# Patient Record
Sex: Female | Born: 1935 | Race: White | Hispanic: No
Health system: Southern US, Community
[De-identification: ages and names within clinical notes are randomized; demographics above are authoritative.]

---

## 2003-09-07 ENCOUNTER — Other Ambulatory Visit: Payer: Self-pay

## 2004-02-14 ENCOUNTER — Ambulatory Visit: Payer: Self-pay | Admitting: General Surgery

## 2004-12-13 ENCOUNTER — Ambulatory Visit: Payer: Self-pay | Admitting: General Surgery

## 2011-01-11 ENCOUNTER — Ambulatory Visit: Payer: Self-pay | Admitting: Gastroenterology

## 2011-09-12 ENCOUNTER — Ambulatory Visit: Payer: Self-pay | Admitting: Gastroenterology

## 2011-09-18 ENCOUNTER — Ambulatory Visit: Payer: Self-pay | Admitting: Gastroenterology

## 2011-10-01 ENCOUNTER — Ambulatory Visit: Payer: Self-pay | Admitting: Internal Medicine

## 2011-10-01 ENCOUNTER — Emergency Department: Payer: Self-pay | Admitting: Emergency Medicine

## 2011-10-01 LAB — BASIC METABOLIC PANEL
Anion Gap: 6 — ABNORMAL LOW (ref 7–16)
Chloride: 105 mmol/L (ref 98–107)
Co2: 29 mmol/L (ref 21–32)
EGFR (African American): >60
Glucose: 123 mg/dL — ABNORMAL HIGH (ref 65–99)
Potassium: 4.2 mmol/L (ref 3.5–5.1)

## 2011-10-01 LAB — CBC
HCT: 41.7 % (ref 35.0–47.0)
HGB: 14 g/dL (ref 12.0–16.0)
MCH: 29.4 pg (ref 26.0–34.0)
MCHC: 33.5 g/dL (ref 32.0–36.0)
MCV: 88 fL (ref 80–100)
RBC: 4.76 10*6/uL (ref 3.80–5.20)
RDW: 18.4 % — ABNORMAL HIGH (ref 11.5–14.5)

## 2011-10-01 LAB — PROTIME-INR: Prothrombin Time: 13.3 secs (ref 11.5–14.7)

## 2011-10-01 LAB — HEPATIC FUNCTION PANEL A (ARMC)
Alkaline Phosphatase: 77 U/L (ref 50–136)
Bilirubin, Direct: 0.1 mg/dL (ref 0.00–0.20)
Bilirubin,Total: 0.4 mg/dL (ref 0.2–1.0)
SGPT (ALT): 14 U/L (ref 12–78)
Total Protein: 6.6 g/dL (ref 6.4–8.2)

## 2011-10-01 LAB — URINALYSIS, COMPLETE
Bacteria: NONE SEEN
Bilirubin,UR: NEGATIVE
Blood: NEGATIVE
Ketone: NEGATIVE
RBC,UR: 1 /HPF (ref 0–5)
Squamous Epithelial: NONE SEEN
WBC UR: 1 /HPF (ref 0–5)

## 2011-12-07 DEATH — deceased

## 2013-05-26 IMAGING — CT CT ABD-PELV W/ CM
1 of 3 series · 14 of 32 positions shown, 19 images · IV contrast (isovue)
Comparison: None

REASON FOR EXAM: weight loss dyspepsia gen abd discomfort and pain
COMMENTS:

PROCEDURE:     KCT - KCT ABDOMEN/PELVIS W  - September 18, 2011 [DATE]
RESULT:     History: Dyspepsia, weight loss
TECHNIQUE: Multiple axial images of the abdomen and pelvis were performed
from the lung bases to the pubic symphysis, with p.o. contrast and with 85
ml of Isovue 300 intravenous contrast.

[Series 2: abd 3mm w 3.0 i40f 3 · axial · 0.70mm/px · z∈[-979,-637]mm · 14 of 130 slices shown, 19 images]
[im 8/130  soft-tissue]
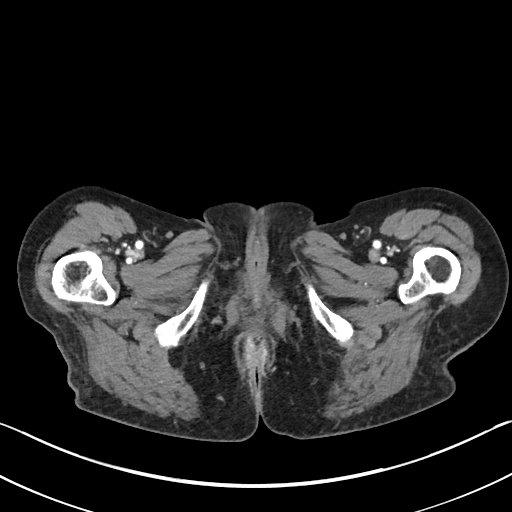
[im 8/130  bone]
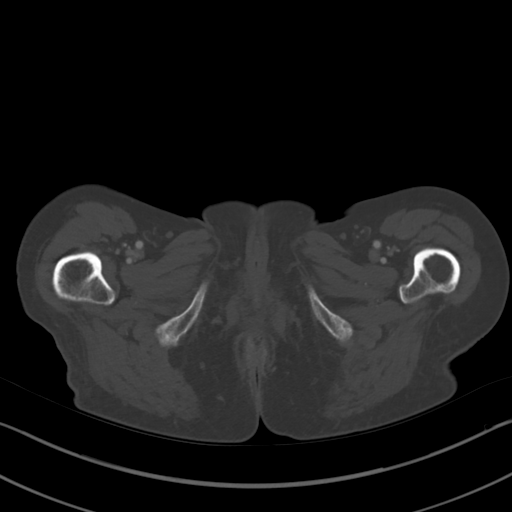
[im 15/130  soft-tissue]
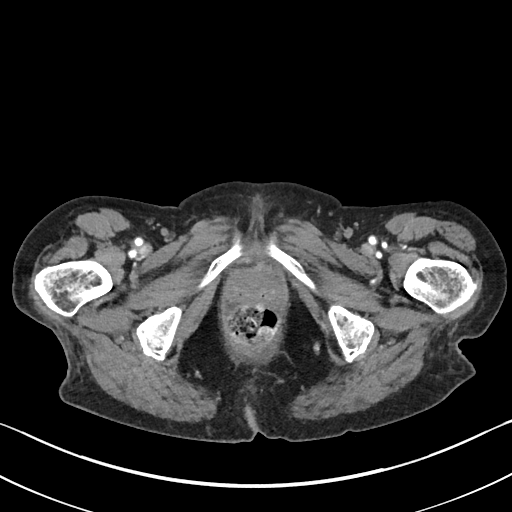
[im 29/130  soft-tissue]
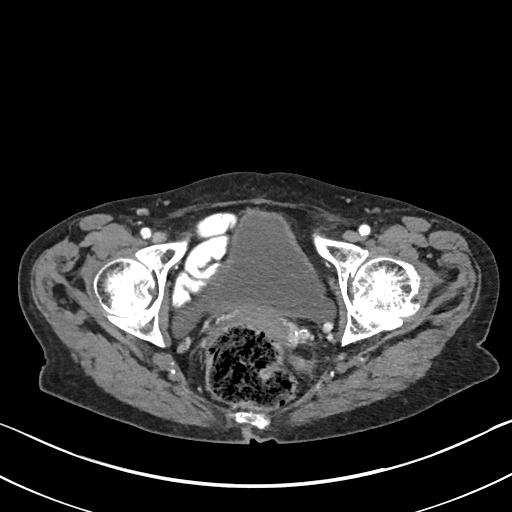
[im 36/130  soft-tissue]
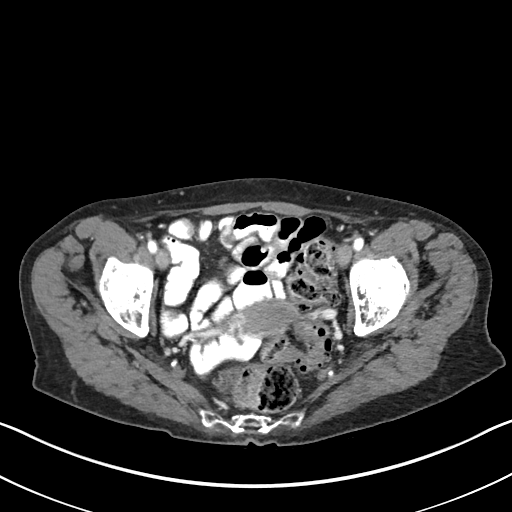
[im 44/130  soft-tissue]
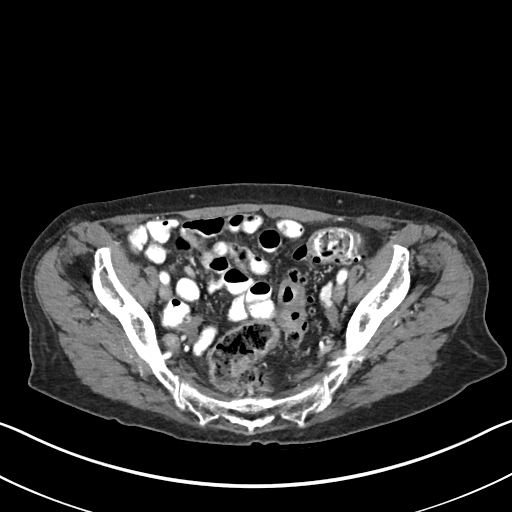
[im 58/130  soft-tissue]
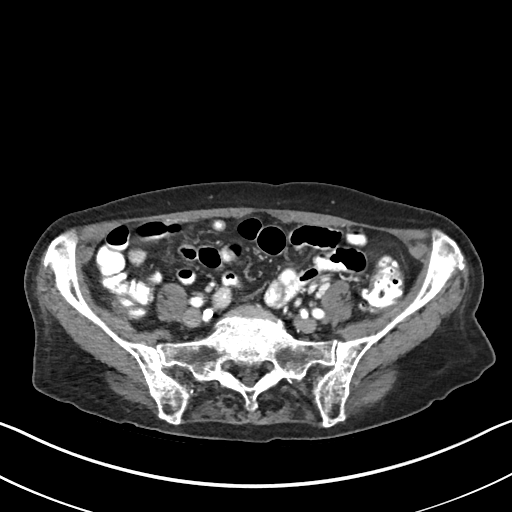
[im 65/130  soft-tissue]
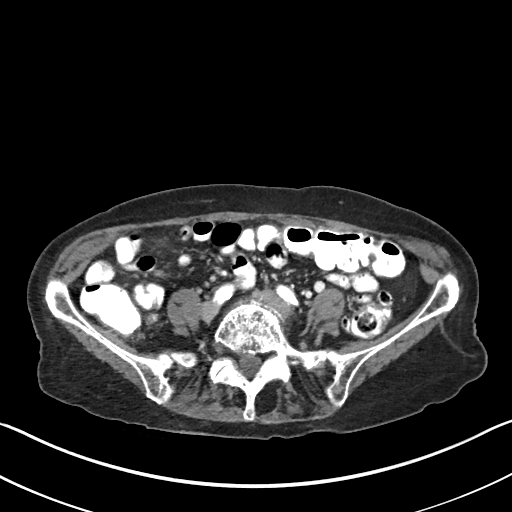
[im 72/130  soft-tissue]
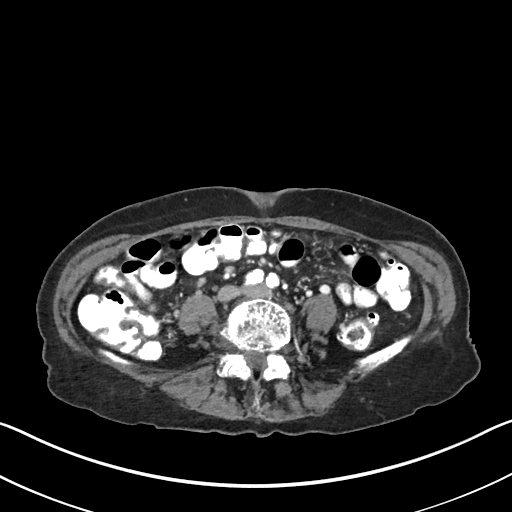
[im 87/130  soft-tissue]
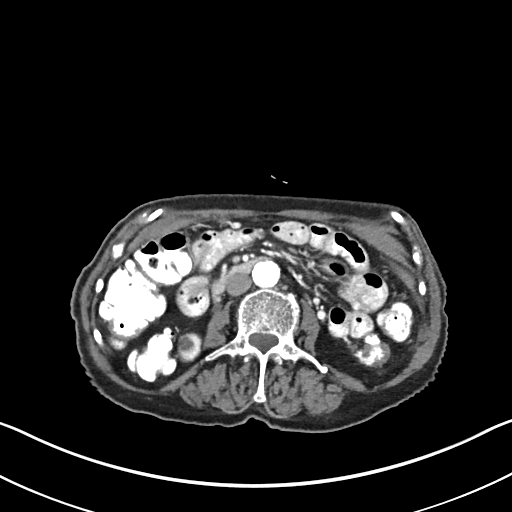
[im 87/130  bone]
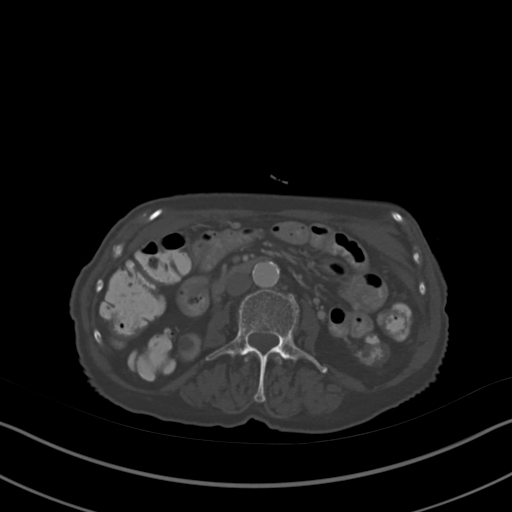
[im 94/130  soft-tissue]
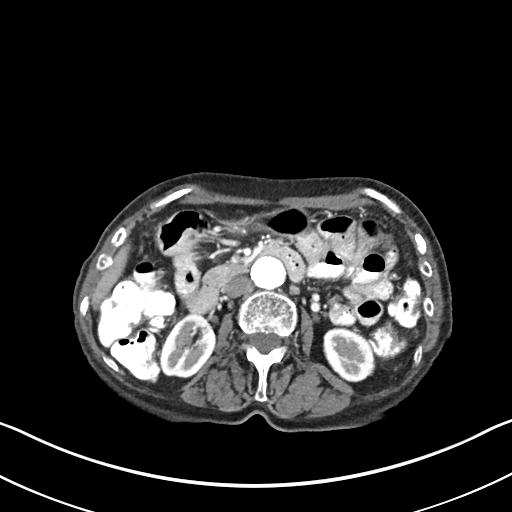
[im 101/130  soft-tissue]
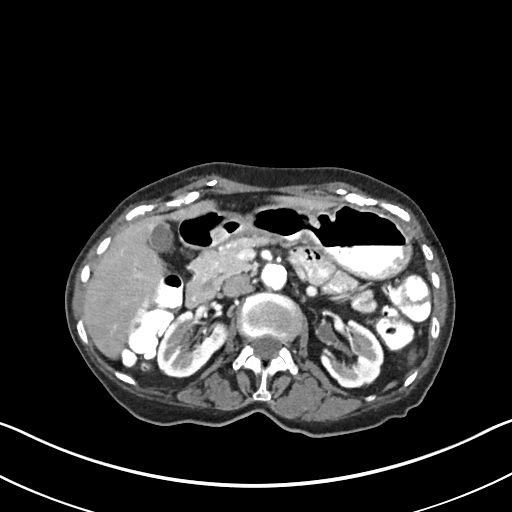
[im 101/130  lung]
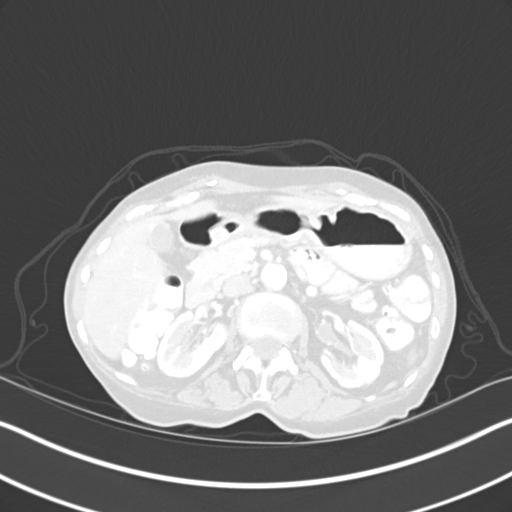
[im 108/130  lung]
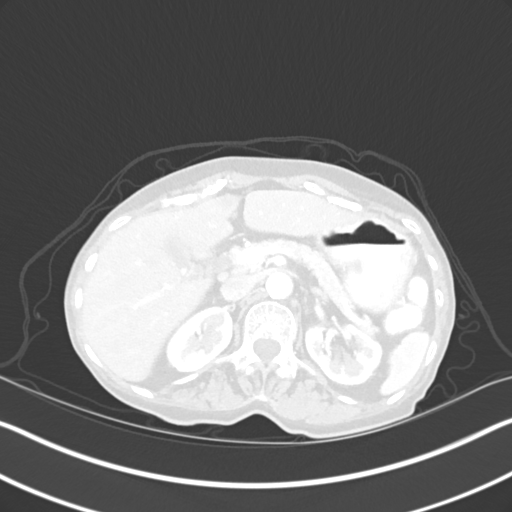
[im 115/130  soft-tissue]
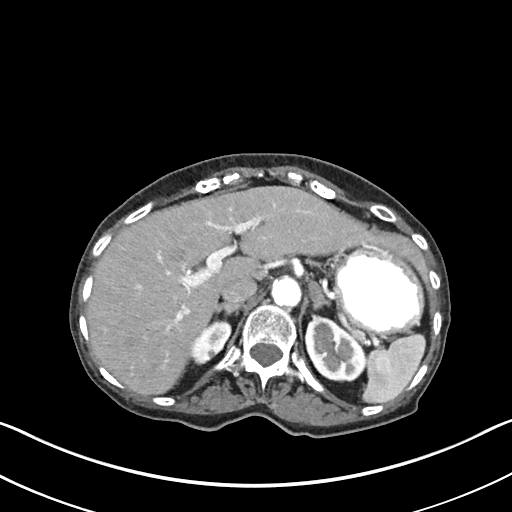
[im 115/130  lung]
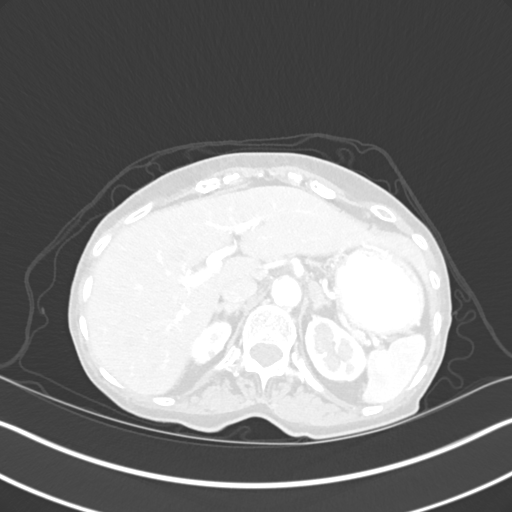
[im 122/130  soft-tissue]
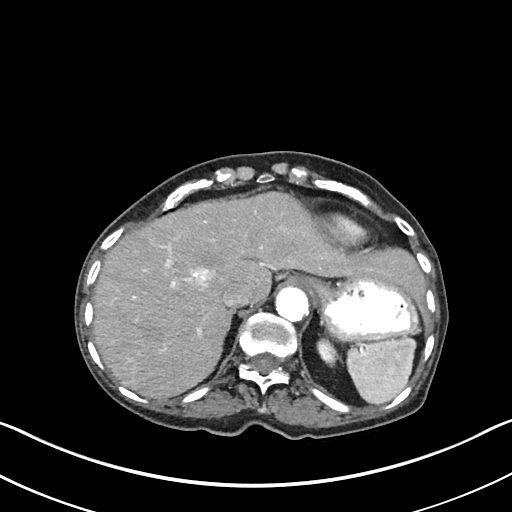
[im 122/130  lung]
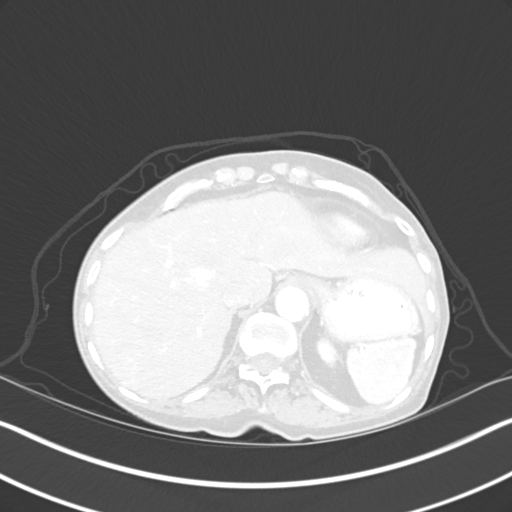

[14 of 32 positions shown; findings below may reference images not displayed]

FINDINGS: The lung bases are clear. There is no pneumothorax. The heart size is
normal. There is coronary artery atherosclerosis.

The liver demonstrates no focal abnormality. There is no intrahepatic or
extrahepatic biliary ductal dilatation. The gallbladder is unremarkable. The
spleen demonstrates no focal abnormality. The kidneys, adrenal glands, and
pancreas are normal. The bladder is unremarkable.

The stomach, duodenum, small intestine, and large intestine demonstrate no
contrast extravasation or dilatation. There is diverticulosis without
evidence of diverticulitis. There is a large amount of stool in the rectum.
There is no pneumoperitoneum, pneumatosis, or portal venous gas. There is no
abdominal or pelvic free fluid. There is no lymphadenopathy.

The abdominal aorta is normal in caliber with atherosclerosis.

The osseous structures are unremarkable.
IMPRESSION: 1. No acute abdominal or pelvic pathology.

[REDACTED]

## 2013-06-08 IMAGING — CT CT CHEST W/ CM
1 series · 15 of 33 positions shown, 19 images · IV contrast (agent unspecified)
Comparison: none

REASON FOR EXAM: Labs left hilar mass seen on chest xray
COMMENTS:

PROCEDURE:     KCT - KCT CHEST WITH CONTRAST  - October 01, 2011  [DATE]
RESULT:     A cecal left hilar mass
Comparison Study: No recent.

[Series 2: chest w/ 3.0 i41f 3 · axial · 0.63mm/px · z∈[-606,-348]mm · 15 of 102 slices shown, 19 images]
[im 8/102  mediastinal]
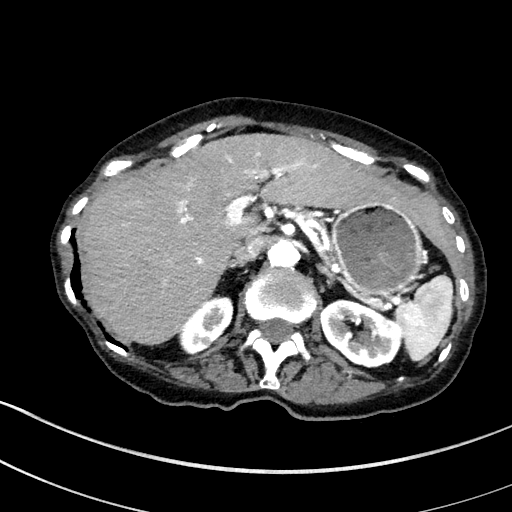
[im 8/102  lung]
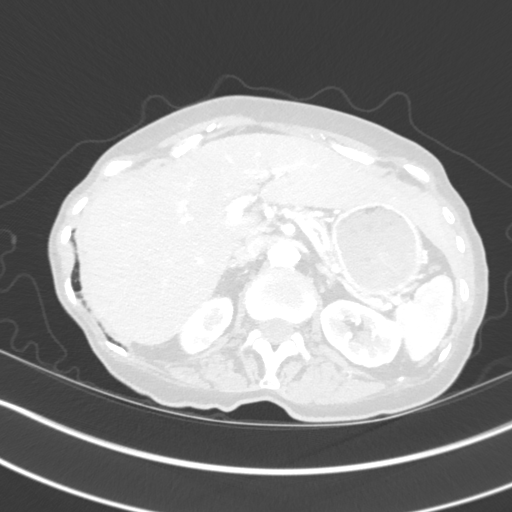
[im 15/102  lung]
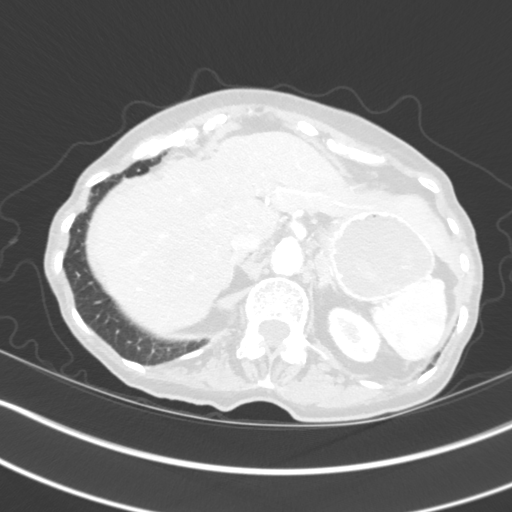
[im 21/102  lung]
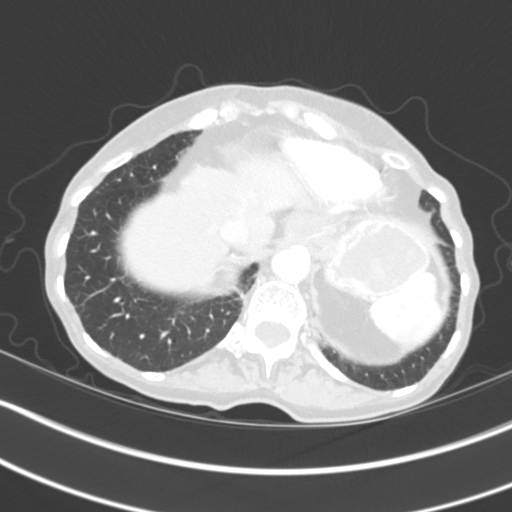
[im 27/102  lung]
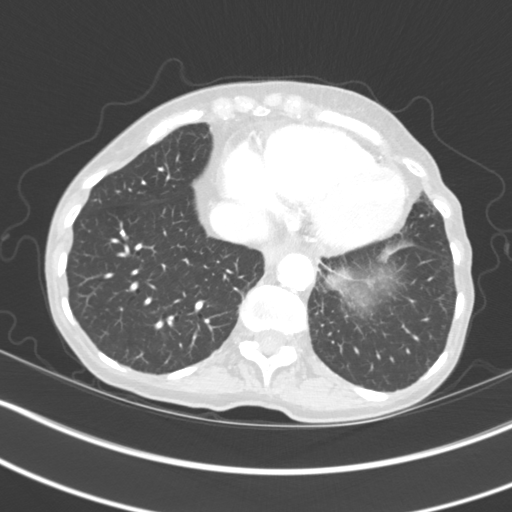
[im 34/102  mediastinal]
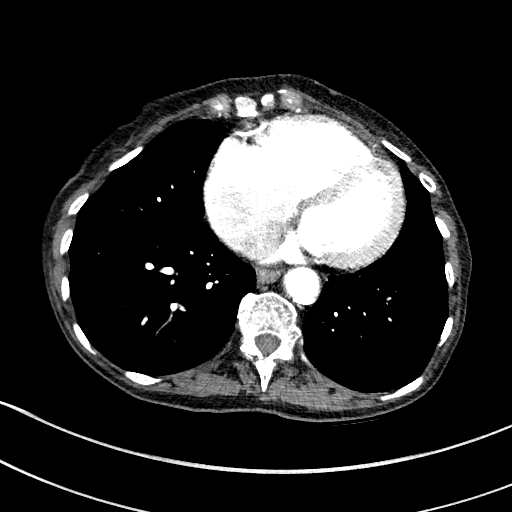
[im 34/102  lung]
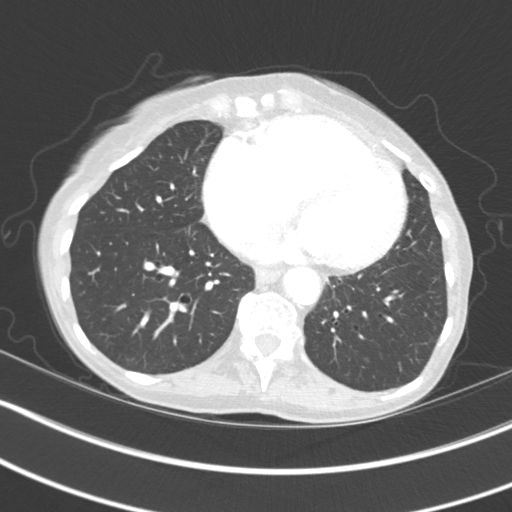
[im 41/102  lung]
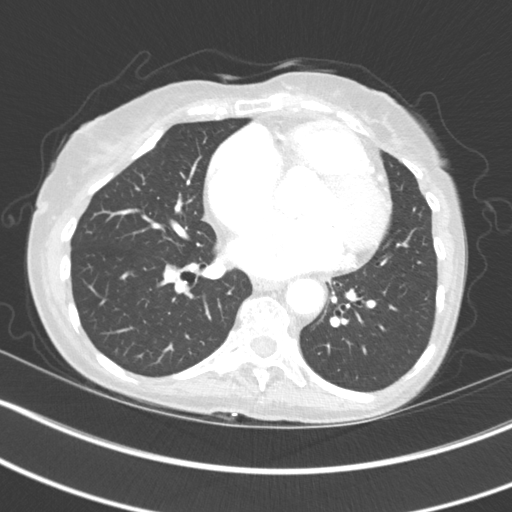
[im 45/102  lung]
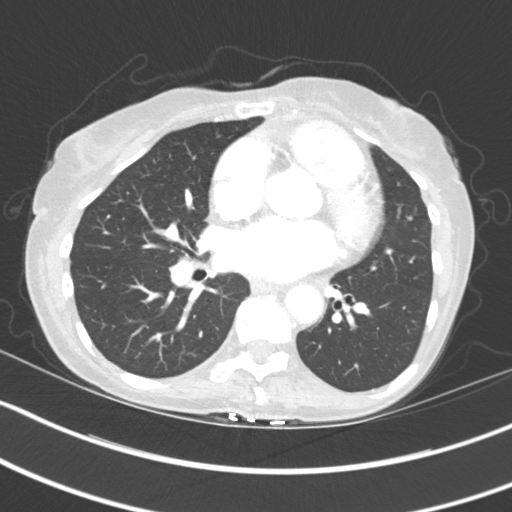
[im 53/102  lung]
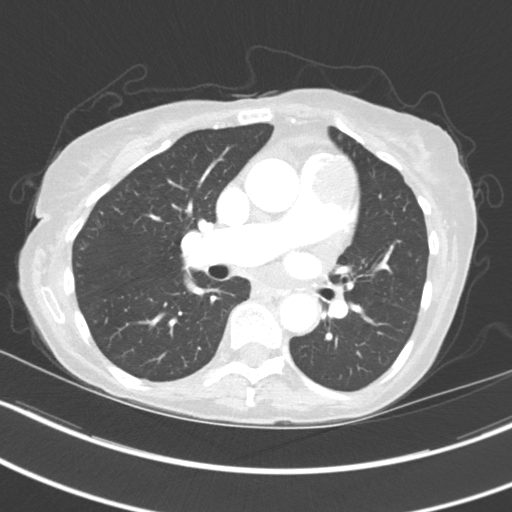
[im 57/102  mediastinal]
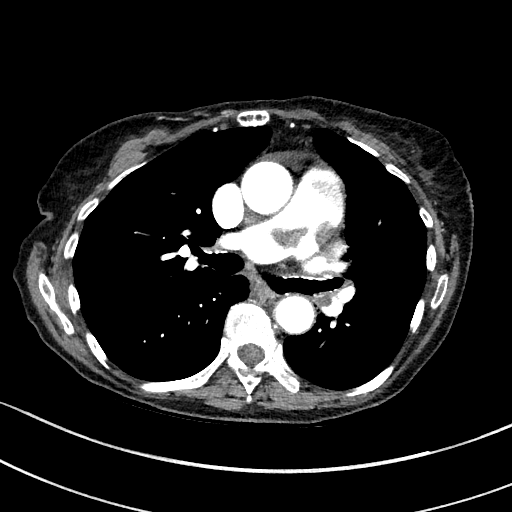
[im 57/102  lung]
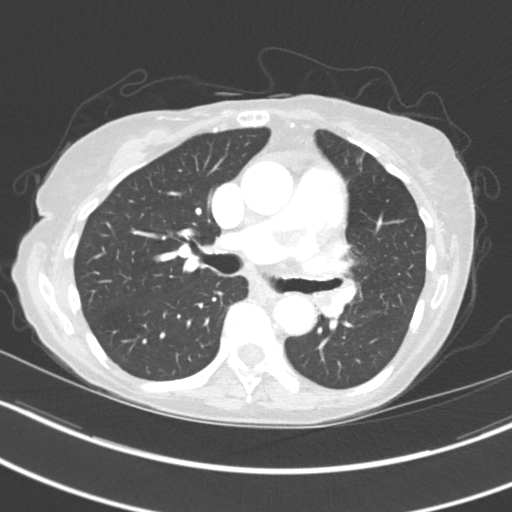
[im 61/102  lung]
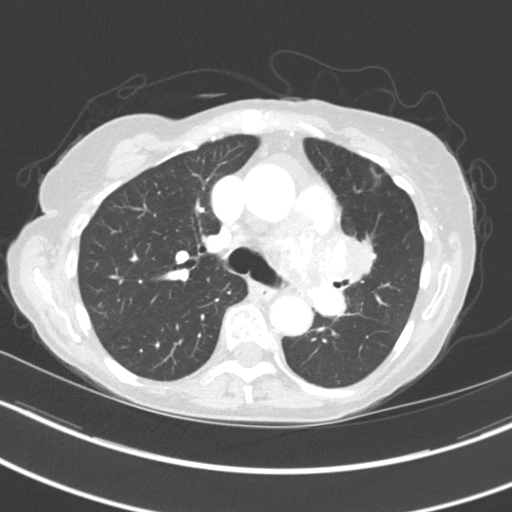
[im 68/102  lung]
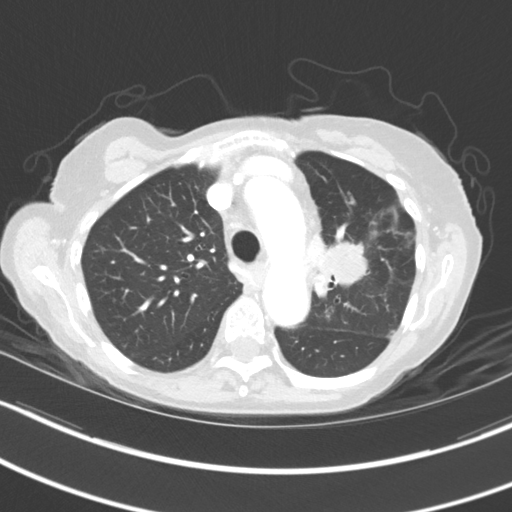
[im 75/102  lung]
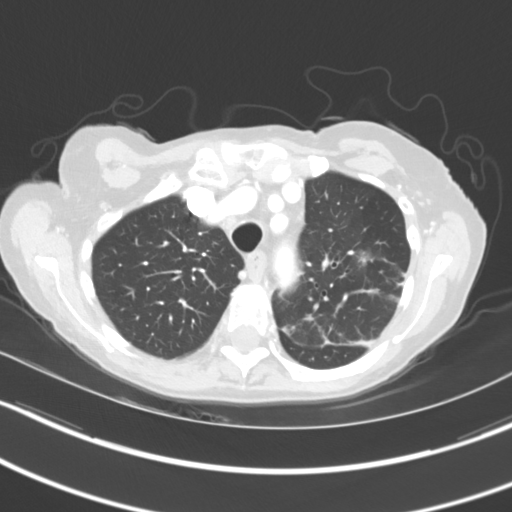
[im 81/102  mediastinal]
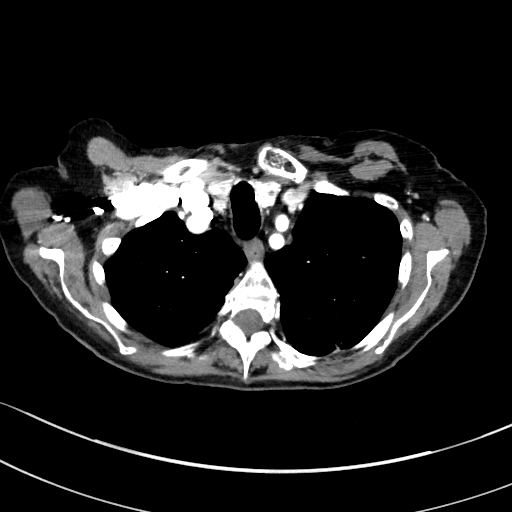
[im 81/102  lung]
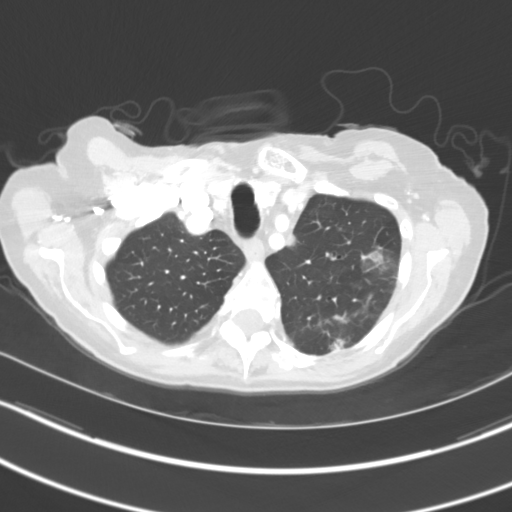
[im 87/102  lung]
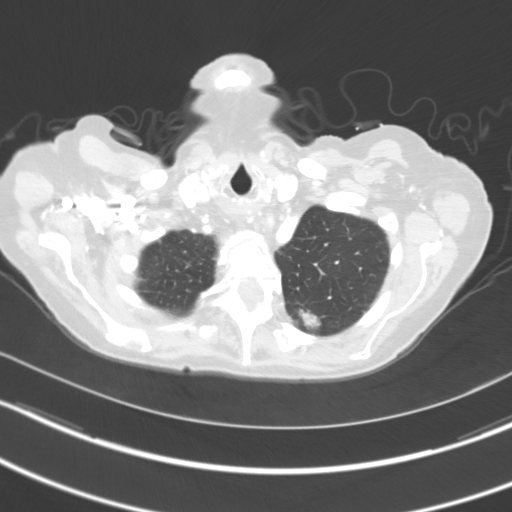
[im 94/102  lung]
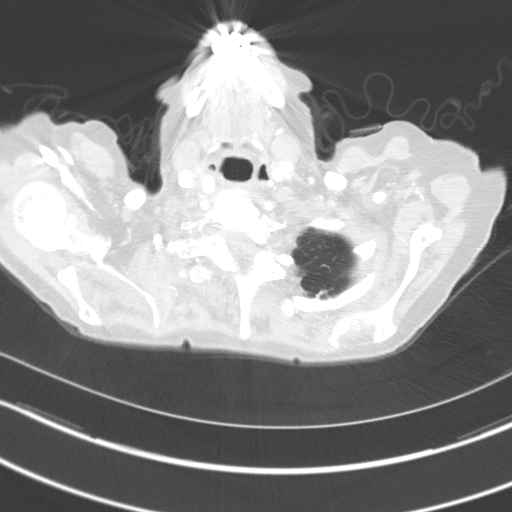

[15 of 33 positions shown; findings below may reference images not displayed]

FINDINGS: Standard CT CT obtained as a 5 cc of Usovue-N77. Thoracic aorta is
unremarkable. Very large 6.4 cm maximum diameter mass is present in the left
hilar region. There calcific densities associated with the mass. The mass
may involve the pulmonary artery. Large intraluminal filling defects in the
main pulmonary artery are present to this could be a tumor or pulmonary
embolus. Thrombus may also be present in the right atrium. Coronary artery
disease. Areas of atelectasis present. Large airways patent. Adrenals
normal. Report phoned to Dr. Narikawa at time of study.
IMPRESSION: Large mass in the left hilum and mediastinum with probable
invasion of the main pulmonary artery. This is most consistent with
malignancy.
2. Soft tissue densities in the main pulmonary artery and possibly the right
atrium. These could this could represent tumor or thrombus. The lesion in
the main pulmonary artery is near occlusive.

## 2013-06-08 IMAGING — US US EXTREM LOW VENOUS BILAT
1 series · 14 of 24 positions shown · non-contrast
Comparison: none

REASON FOR EXAM: poss PE eval for DVT
COMMENTS:

[Series 1: us extrem low venous bilat · 0.07mm/px · 14 of 56 slices shown]
[im 1/56]
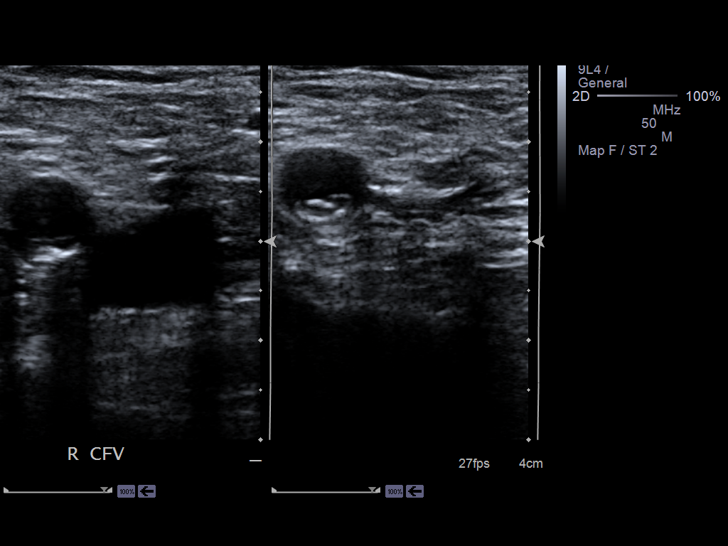
[im 5/56]
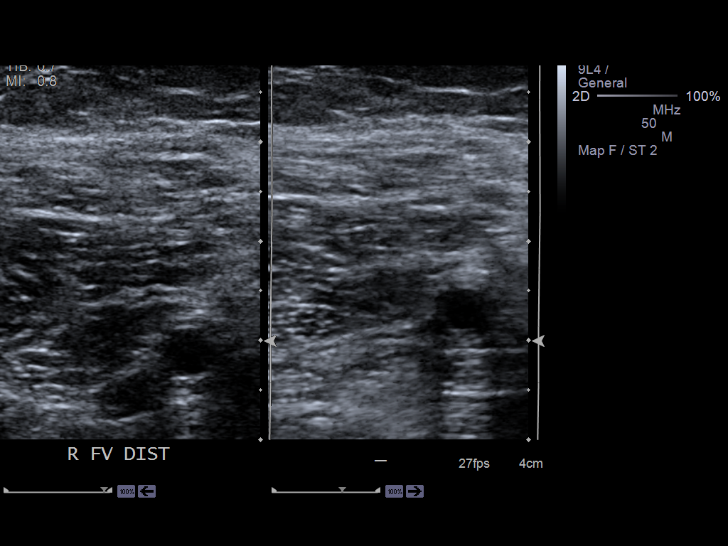
[im 10/56]
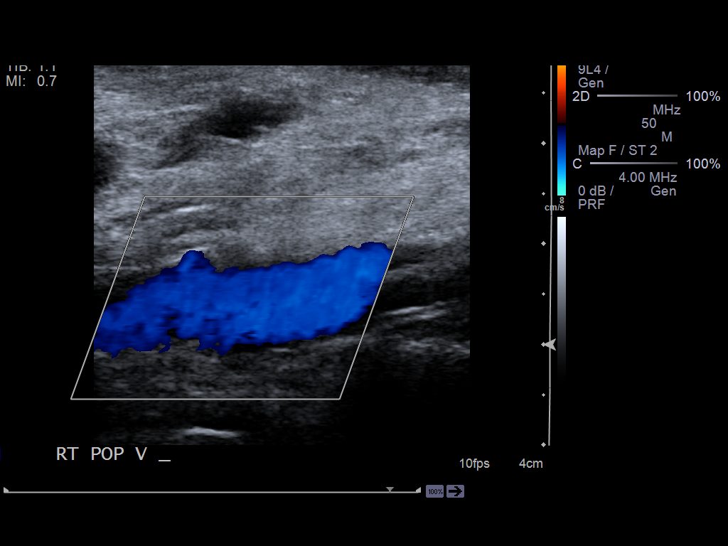
[im 15/56]
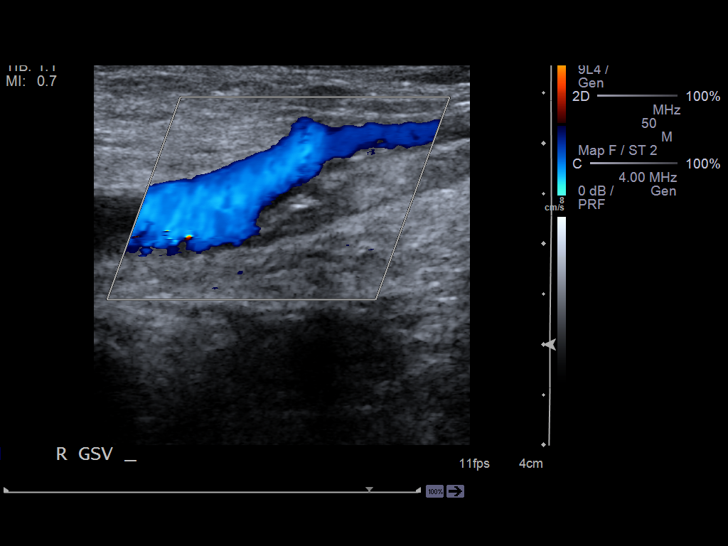
[im 17/56]
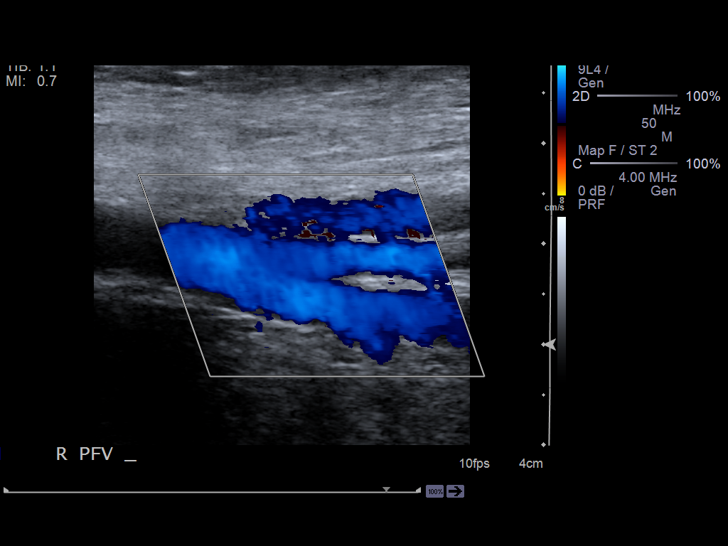
[im 22/56]
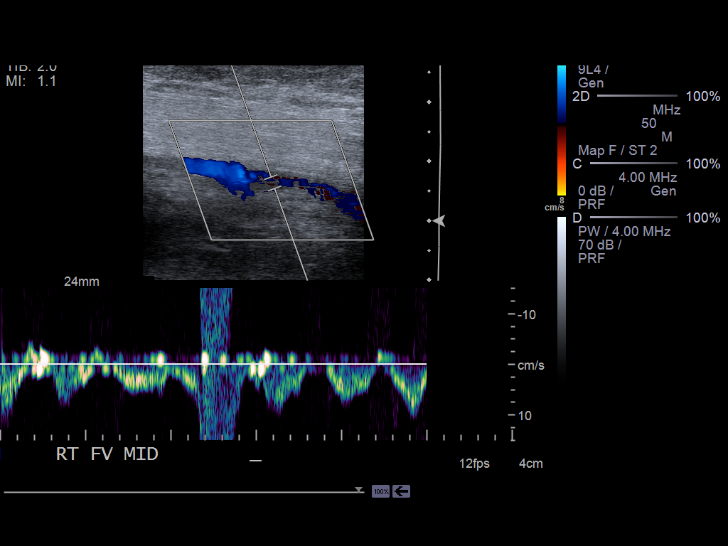
[im 27/56]
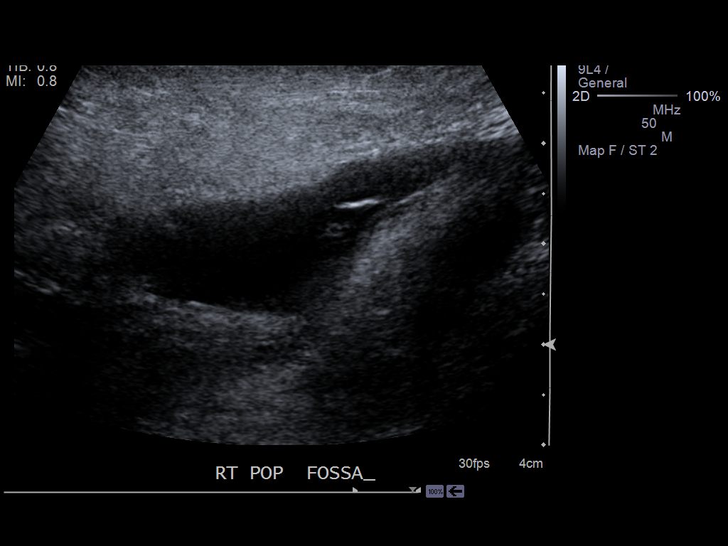
[im 29/56]
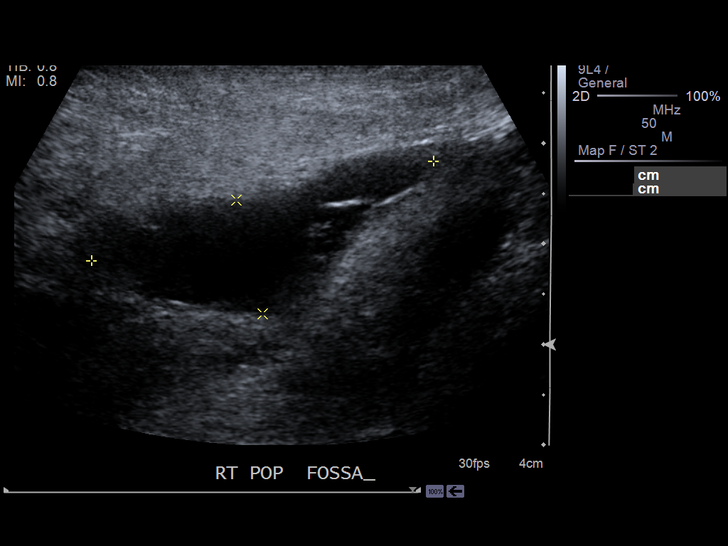
[im 34/56]
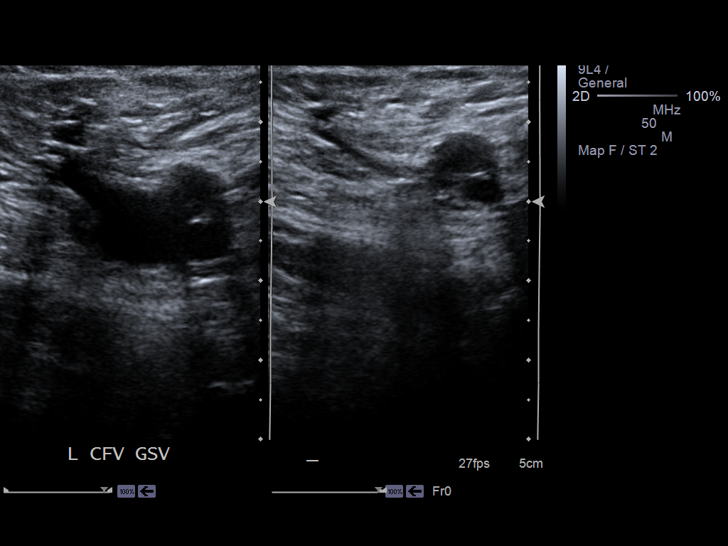
[im 39/56]
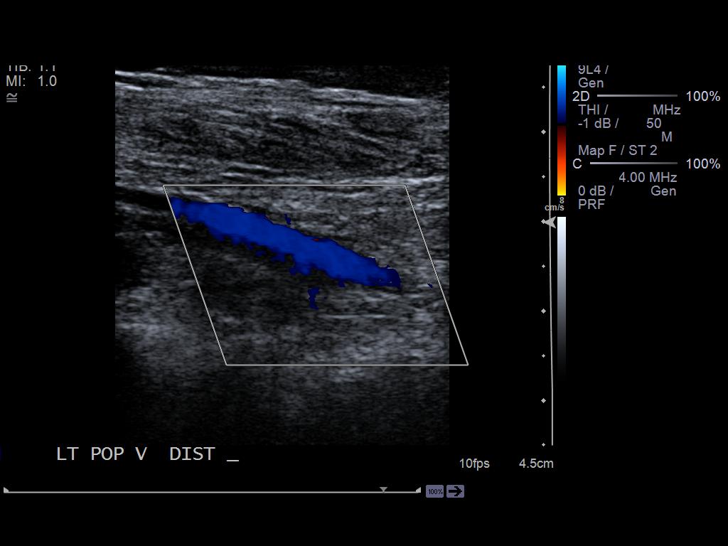
[im 44/56]
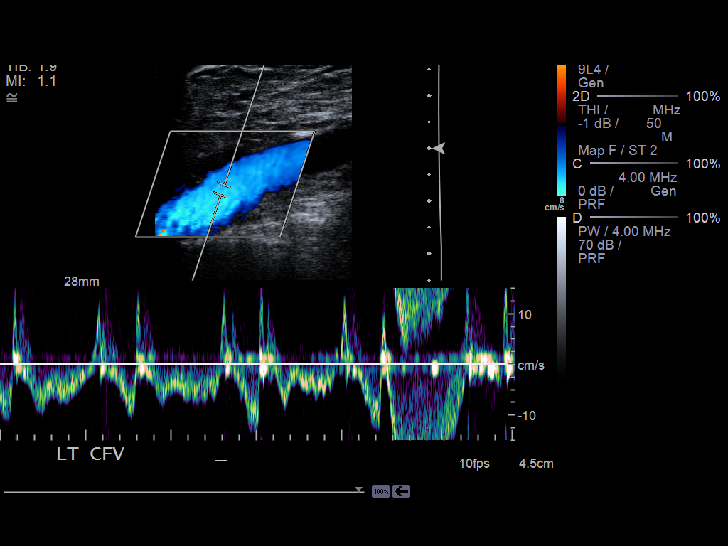
[im 46/56]
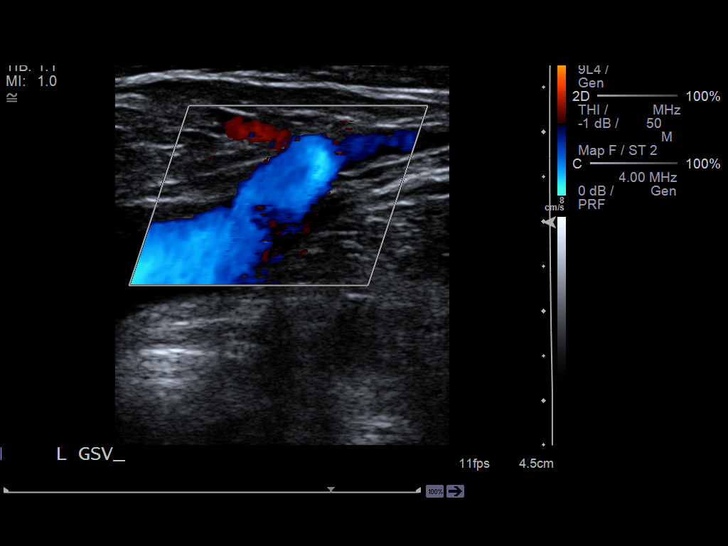
[im 51/56]
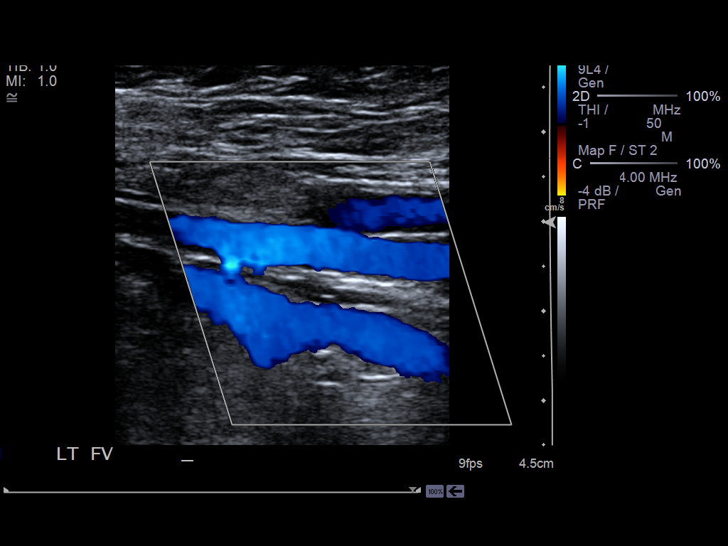
[im 56/56]
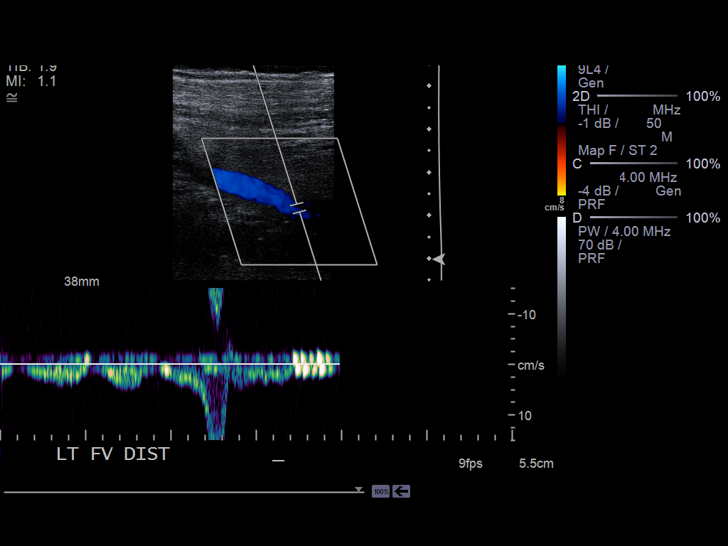

[14 of 24 positions shown; findings below may reference images not displayed]

PROCEDURE:     US  - US DOPPLER LOW EXTR BILATERAL  - October 01, 2011  [DATE]

RESULT:     Duplex Doppler interrogation of the deep venous system of both
legs from the inguinal to the popliteal region demonstrates the deep venous
systems are fully compressible throughout. The color Doppler and spectral
Doppler appearance is normal. There is normal response to distal
augmentation. The color Doppler images show no filling defect. There is a
fluid collection in the popliteal fossa of the right leg measuring 3.54 x
1.16 x 4.57 cm consistent with a popliteal fossa or Baker's cyst.
IMPRESSION: 1. No evidence of DVT in either lower extremity.
2. Right popliteal fossa cyst as described.

[REDACTED]

## 2014-05-25 NOTE — Consult Note (Signed)
PATIENT NAME:  Sophia Hamilton, Sophia Hamilton MR#:  [id number] DATE OF BIRTH:  10/18/1924  DATE OF CONSULTATION:  10/01/2011  REFERRING PHYSICIAN:  Dr. Robert Kinner from the Emergency Room  CONSULTING PHYSICIAN:  Marshall Hamilton. Anderson, MD  REASON FOR CONSULTATION: Lung mass.   HISTORY OF PRESENT ILLNESS: Ms. Kimiye is an 81-year-old lady with moderate or more dementia who had been undergoing a work-up for dysphagia and weight loss as an outpatient and during this she was found to have lung mass. She had a CT scan done of her lung mass today at the outlying [facility] radiology facility. There she was found to have a large mediastinal mass that seemed to be invading the pulmonary artery and/or having clot in the pulmonary artery and the right atrium. Despite this, she has been doing relatively well lately, has been up and around in the house. Her daughter is with her often and helps things out a good deal. Her daughter is with her today. She is confused, really cannot give much more history than that.   REVIEW OF SYSTEMS: Seems to deny pain, though complete review of systems is impossible with her dementia.   PAST MEDICAL HISTORY: 1. Hypertension.  2. Dementia.  3. Hyperlipidemia.  4. Severe hearing deficit preceding above.  5. Osteoarthritis.  6. Aortic stenosis, which was moderate in 2011 along with moderate to severe aortic insufficiency at that time.   PAST SURGICAL HISTORY: Cochlear implant 2008.  MEDICATIONS: Medications as of 08/2011 were: 1. Allegra 180 daily.  2. Flonase two sprays daily.  3. Imipramine 10 mg at bedtime.  4. Lisinopril 20 mg daily.  5. Metoprolol 50 mg daily one half b.i.d.  6. Tramadol p.r.n.  7. Nifedipine 30 mg daily.  8. Omeprazole 40 mg daily.   ALLERGIES: Zocor causes myalgias. Fosamax causes myalgias.  FAMILY HISTORY: Both parents are deceased. Mother died age at 29, not clear of what. Father died at 80.   SOCIAL HISTORY: Fourth grade education. No alcohol or tobacco abuse.  She is widowed. One daughter that is close and very helpful with her.   PHYSICAL EXAMINATION:  GENERAL: Elderly, cachetic, lying in the gurney, no acute distress.   VITAL SIGNS: Blood pressure 161/91 on admission, pulse oximetry 98% at triage, temperature 95.9, pulse 84, respirations 18.   HEENT: TMs clear. op clear.   NECK: No bruits, adenopathy, jugular venous distention.   CHEST: Clear to auscultation and percussion.   HEART: Regular rate and rhythm with a low pitched systolic murmur at the apex, possible rub as well though difficult to tell for sure.   ABDOMEN: Soft, flat, nontender. No hepatosplenomegaly.  EXTREMITIES: No cyanosis, edema.    SKIN: No lesions noted.   NEUROLOGIC: Nonfocal other than hearing and decreased mentation though she is not sedate, she is confused.   LABORATORY, DIAGNOSTIC AND RADIOLOGICAL DATA: Normal LFTs. MET-B unremarkable. Normal white count, hemoglobin 14.0, troponin is normal. CT of her chest as noted above showed large mass in the left hilum and mediastinum with probable invasion of the main pulmonary artery. There is some soft tissue density in the main pulmonary, possibly the right atrium as well which is not specific for tumor or thrombus.   ASSESSMENT AND PLAN: Large mediastinal mass, maybe pulmonary artery. Discussed this at length with her daughter. Given her overall situation dementia and cachexia, etc. with a BMI of only 17 I do not think she will be able to tolerate any aggressive therapy. With her reasonable functional status I do   not think making her much less functional and likely quite miserable with the treatment would be worth her while and certainly things will not be curative. Did briefly discussed this with Dr. Raul Del who thought she had months at best even with good response to treatment. Therefore, her daughter and I were in agreement that the best case for Ms. Castille would be to have hospice be involved. At this point she is  functional enough to do hospice at home. Trying to arrange a consult. I did give her a prescription for Percocet #30 today to be taken 1 to 2 every four p.r.n. Daughter will call with any further questions or if she changes her mind in treatment processes.   ____________________________ Ocie Cornfield. Ouida Sills, MD mwa:cms D: 10/01/2011 18:24:49 ET T: 10/02/2011 08:44:02 ET JOB#: [id number]  cc: Ocie Cornfield. Ouida Sills, MD, <Dictator> Kirk Ruths MD ELECTRONICALLY SIGNED 10/02/2011 21:29
